# Patient Record
Sex: Male | Born: 2000 | Race: White | Hispanic: No | Marital: Single | Smoking: Current some day smoker
Health system: Southern US, Community
[De-identification: ages and names within clinical notes are randomized; demographics above are authoritative.]

## PROBLEM LIST (undated history)

## (undated) HISTORY — PX: NO PAST SURGERIES: SHX2092

---
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
## 2021-11-23 ENCOUNTER — Ambulatory Visit: Payer: 59 | Admitting: Nurse Practitioner

## 2021-11-23 ENCOUNTER — Encounter: Payer: Self-pay | Admitting: Nurse Practitioner

## 2021-11-23 VITALS — BP 126/78 | HR 95 | Temp 97.5°F | Resp 14 | Ht 66.75 in | Wt 142.1 lb

## 2021-11-23 DIAGNOSIS — F520 Hypoactive sexual desire disorder: Secondary | ICD-10-CM | POA: Diagnosis not present

## 2021-11-23 DIAGNOSIS — R5383 Other fatigue: Secondary | ICD-10-CM

## 2021-11-23 DIAGNOSIS — Z0000 Encounter for general adult medical examination without abnormal findings: Secondary | ICD-10-CM

## 2021-11-23 LAB — LIPID PANEL
Cholesterol: 175 mg/dL (ref 0–200)
HDL: 55.9 mg/dL (ref >39.00)
LDL Cholesterol: 105 mg/dL — ABNORMAL HIGH (ref 0–99)
NonHDL: 119.13
Total CHOL/HDL Ratio: 3
Triglycerides: 73 mg/dL (ref 0.0–149.0)
VLDL: 14.6 mg/dL (ref 0.0–40.0)

## 2021-11-23 LAB — CBC WITH DIFFERENTIAL/PLATELET
Basophils Absolute: 0 10*3/uL (ref 0.0–0.1)
Basophils Relative: 0.5 % (ref 0.0–3.0)
Eosinophils Absolute: 0.1 10*3/uL (ref 0.0–0.7)
Eosinophils Relative: 1.8 % (ref 0.0–5.0)
HCT: 44 % (ref 39.0–52.0)
Hemoglobin: 14.9 g/dL (ref 13.0–17.0)
Lymphocytes Relative: 32.3 % (ref 12.0–46.0)
Lymphs Abs: 1.9 10*3/uL (ref 0.7–4.0)
MCHC: 33.8 g/dL (ref 30.0–36.0)
MCV: 93.5 fl (ref 78.0–100.0)
Monocytes Absolute: 0.6 10*3/uL (ref 0.1–1.0)
Monocytes Relative: 10.5 % (ref 3.0–12.0)
Neutro Abs: 3.2 10*3/uL (ref 1.4–7.7)
Neutrophils Relative %: 54.9 % (ref 43.0–77.0)
Platelets: 365 10*3/uL (ref 150.0–400.0)
RBC: 4.71 Mil/uL (ref 4.22–5.81)
RDW: 12.7 % (ref 11.5–14.6)
WBC: 5.8 10*3/uL (ref 4.5–10.5)

## 2021-11-23 LAB — TESTOSTERONE: Testosterone: 681.72 ng/dL (ref 350.00–890.00)

## 2021-11-23 LAB — IBC + FERRITIN
Ferritin: 49.9 ng/mL (ref 22.0–322.0)
Iron: 107 ug/dL (ref 42–165)
Saturation Ratios: 31.7 % (ref 20.0–50.0)
TIBC: 337.4 ug/dL (ref 250.0–450.0)
Transferrin: 241 mg/dL (ref 212.0–360.0)

## 2021-11-23 LAB — COMPREHENSIVE METABOLIC PANEL
ALT: 17 U/L (ref 0–53)
AST: 18 U/L (ref 0–37)
Albumin: 4.6 g/dL (ref 3.5–5.2)
Alkaline Phosphatase: 57 U/L (ref 39–117)
BUN: 11 mg/dL (ref 6–23)
CO2: 32 mEq/L (ref 19–32)
Calcium: 9.8 mg/dL (ref 8.4–10.5)
Chloride: 99 mEq/L (ref 96–112)
Creatinine, Ser: 0.89 mg/dL (ref 0.40–1.50)
GFR: 123.79 mL/min (ref >60.00)
Glucose, Bld: 109 mg/dL — ABNORMAL HIGH (ref 70–99)
Potassium: 4.4 mEq/L (ref 3.5–5.1)
Sodium: 139 mEq/L (ref 135–145)
Total Bilirubin: 0.7 mg/dL (ref 0.2–1.2)
Total Protein: 7.3 g/dL (ref 6.0–8.3)

## 2021-11-23 LAB — TSH: TSH: 1.3 u[IU]/mL (ref 0.35–5.50)

## 2021-11-23 LAB — VITAMIN B12: Vitamin B-12: 348 pg/mL (ref 211–911)

## 2021-11-23 NOTE — Assessment & Plan Note (Addendum)
Not currently sexually active but does masturbate.  States he is strictly masturbates about 5 times a week and as of late, but about once a week states that the desire is still a fair but does have some trouble obtaining an erection but once he gets it no trouble thereafter.  We will check TSH along with testosterone in office.  Genital exam within normal limits ?

## 2021-11-23 NOTE — Assessment & Plan Note (Signed)
Discussed age-appropriate immunizations and screening exams.  Patient is not up-to-date on all childhood vaccinations.  Did review the ones that he was missing and recommendations to get those vaccines.  Patient would like to defer currently.  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  Told him I strongly recommend he get a tetanus updated vaccination.  States he will talk to his family about it did inform him we have them here and he can get them at the local pharmacy. ?

## 2021-11-23 NOTE — Assessment & Plan Note (Signed)
Complaining about increased fatigue as of late has not been evaluated by provider in many years per patient report.  Did have hormone panel drawn testosterone of 6 binding hormones with normal estradiol and prolactin slightly elevated.  We will repeat testosterone in office today ?

## 2021-11-23 NOTE — Patient Instructions (Signed)
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

## 2021-11-23 NOTE — Progress Notes (Signed)
? ?Established Patient Office Visit ? ?Subjective   ?Patient ID: Gary Aguirre, male    DOB: 10/01/2000  Age: 21 y.o. MRN: [id number] ? ?Chief Complaint  ?Patient presents with  ? Establish Care  ?  Previous PCP Dr Wilfred Lacy in [place]  ? ? ?HPI ? ?Been having a lot of fatigue and lower sex drive.  Has been over the past month. States that it is more difficult to get the erection (takes longer). Currenlty single. States beofre he would masturbate 5 times a week previously. And now it is once a week. Does use material while masturbating   ? ?for complete physical and follow up of chronic conditions. ? ?Immunizations: ?-Tetanus: not up to date ?-Influenza: out of season ?-Covid-19: refused ?-Shingles: na ?-Pneumonia: na ? ?-HPV: not up to date ? ?Diet: Fair diet. States eating 2 meals day. Some snacking. [place] water and some sport drink 1 maybe two a day ?Exercise: No regular exercise. Started working out over the past 2 weeks it has diminshed 2-3 weeks 1.5-2 ? ?Eye exam: hx of glasses. Last time seeing eye doctor was approx 3 years ago ?Dental exam: Completes semi-annually and see orthodontist ? ?Colonoscopy: NA no family history of colon cancers per patient report ?Dexa: NA ?PSA: NA. Too young. No history of prostate cancers ? ?Lung Cancer Screening: NA ? ?[phone] ? ? ? ?Sleep: nomally goes to bed around 1am and will wake up around 10am. Does not feel rested often. Does not think he snores ? ? ? ? ?Review of Systems  ?Constitutional:  Positive for malaise/fatigue. Negative for chills and fever.  ?Respiratory:  Negative for shortness of breath.   ?Cardiovascular:  Negative for chest pain and leg swelling.  ?Gastrointestinal:  Negative for diarrhea, nausea and vomiting.  ?     BM daily ?  ?Genitourinary:  Negative for dysuria and hematuria.  ?     Nocturia negative  ?Neurological:  Negative for dizziness (stand up too quick), tingling, weakness and headaches.  ?Psychiatric/Behavioral:  Negative for hallucinations  and suicidal ideas.   ? ?  ?Objective:  ?  ? ?BP 126/78   Pulse 95   Temp (!) 97.5 ?F (36.4 ?C)   Resp 14   Ht 5' 6.75" (1.695 m)   Wt 142 lb 2 oz (64.5 kg)   SpO2 99%   BMI 22.43 kg/m?  ? ? ?Physical Exam ?Vitals and nursing note reviewed. Exam conducted with a chaperone present.  ?Constitutional:   ?   Appearance: Normal appearance.  ?HENT:  ?   Right Ear: Tympanic membrane, ear canal and external ear normal.  ?   Left Ear: Tympanic membrane, ear canal and external ear normal.  ?   Mouth/Throat:  ?   Mouth: Mucous membranes are moist.  ?   Pharynx: Oropharynx is clear.  ?Eyes:  ?   Extraocular Movements: Extraocular movements intact.  ?   Pupils: Pupils are equal, round, and reactive to light.  ?Neck:  ?   Thyroid: No thyroid mass, thyromegaly or thyroid tenderness.  ?Cardiovascular:  ?   Rate and Rhythm: Normal rate.  ?   Pulses: Normal pulses.  ?   Heart sounds: Normal heart sounds.  ?Pulmonary:  ?   Effort: Pulmonary effort is normal.  ?   Breath sounds: Normal breath sounds.  ?Abdominal:  ?   General: Bowel sounds are normal. There is no distension.  ?   Palpations: There is no mass.  ?   Tenderness: There is no  abdominal tenderness.  ?   Hernia: No hernia is present.  ?Musculoskeletal:  ?   Right lower leg: No edema.  ?   Left lower leg: No edema.  ?Lymphadenopathy:  ?   Cervical: No cervical adenopathy.  ?Skin: ?   General: Skin is warm.  ?Neurological:  ?   General: No focal deficit present.  ?   Mental Status: He is alert.  ?   Deep Tendon Reflexes:  ?   Reflex Scores: ?     Bicep reflexes are 2+ on the right side and 2+ on the left side. ?     Patellar reflexes are 2+ on the right side and 2+ on the left side. ?   Comments: Bilateral upper and lower extremity strength 5/5  ?Psychiatric:     ?   Mood and Affect: Mood normal.     ?   Behavior: Behavior normal.     ?   Thought Content: Thought content normal.     ?   Judgment: Judgment normal.  ? ? ? ?No results found for any visits on  11/23/21. ? ? ? ?The ASCVD Risk score (Arnett DK, et al., 2019) failed to calculate for the following reasons: ?  The 2019 ASCVD risk score is only valid for ages 20 to 34 ? ?  ?Assessment & Plan:  ? ?Problem List Items Addressed This Visit   ? ?  ? Other  ? Decreased sexual desire  ?  Not currently sexually active but does masturbate.  States he is strictly masturbates about 5 times a week and as of late, but about once a week states that the desire is still a fair but does have some trouble obtaining an erection but once he gets it no trouble thereafter.  We will check TSH along with testosterone in office.  Genital exam within normal limits ? ?  ?  ? Relevant Orders  ? Testosterone  ? Other fatigue  ?  Complaining about increased fatigue as of late has not been evaluated by provider in many years per patient report.  Did have hormone panel drawn testosterone of 6 binding hormones with normal estradiol and prolactin slightly elevated.  We will repeat testosterone in office today ? ?  ?  ? Relevant Orders  ? TSH  ? Vitamin B12  ? IBC + Ferritin  ? Testosterone  ? Preventative health care - Primary  ?  Discussed age-appropriate immunizations and screening exams.  Patient is not up-to-date on all childhood vaccinations.  Did review the ones that he was missing and recommendations to get those vaccines.  Patient would like to defer currently.  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  Told him I strongly recommend he get a tetanus updated vaccination.  States he will talk to his family about it did inform him we have them here and he can get them at the local pharmacy. ? ?  ?  ? Relevant Orders  ? CBC with Differential/Platelet  ? Comprehensive metabolic panel  ? Lipid panel  ? ? ?Return in about 1 year (around 11/24/2022) for cpe.  ? ? ?Romilda Garret, NP ? ?

## 2021-11-27 ENCOUNTER — Telehealth: Payer: Self-pay | Admitting: Nurse Practitioner

## 2021-11-27 NOTE — Telephone Encounter (Signed)
Please return patients call regarding lab results.  

## 2021-11-27 NOTE — Telephone Encounter (Signed)
Patient advised, see lab result note
